# Patient Record
Sex: Female | Born: 1995 | Race: White | Hispanic: No | Marital: Single | Smoking: Never smoker
Health system: Southern US, Community
[De-identification: ages and names within clinical notes are randomized; demographics above are authoritative.]

---
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
## 2014-05-12 ENCOUNTER — Ambulatory Visit: Admit: 2014-05-12 | Disposition: A | Discharge status: Home / Self Care | Payer: Self-pay | Admitting: Family Medicine

## 2014-05-13 ENCOUNTER — Ambulatory Visit: Payer: Self-pay | Admitting: Family Medicine

## 2015-04-14 DIAGNOSIS — M5416 Radiculopathy, lumbar region: Secondary | ICD-10-CM

## 2015-04-14 DIAGNOSIS — M545 Low back pain: Secondary | ICD-10-CM

## 2015-11-30 IMAGING — CR DG FOOT COMPLETE 3+V*L*
1 series · 3 of 3 positions shown · non-contrast
Comparison: None.

CLINICAL DATA: Left foot pain for several weeks, medial dorsal pain

EXAM:
LEFT FOOT - COMPLETE 3+ VIEW

[Series 1: dxr foot lt comp w/obliques · 0.14mm/px · 3 of 3 slices shown]
[im 1/3]
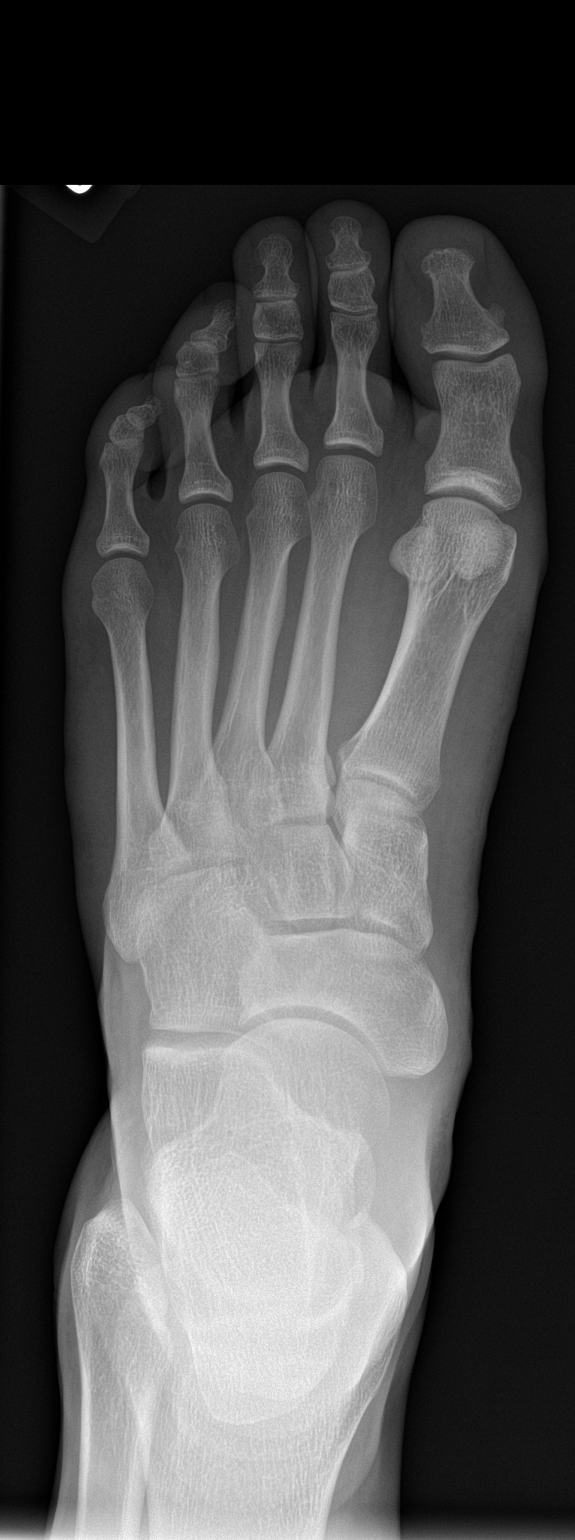
[im 2/3]
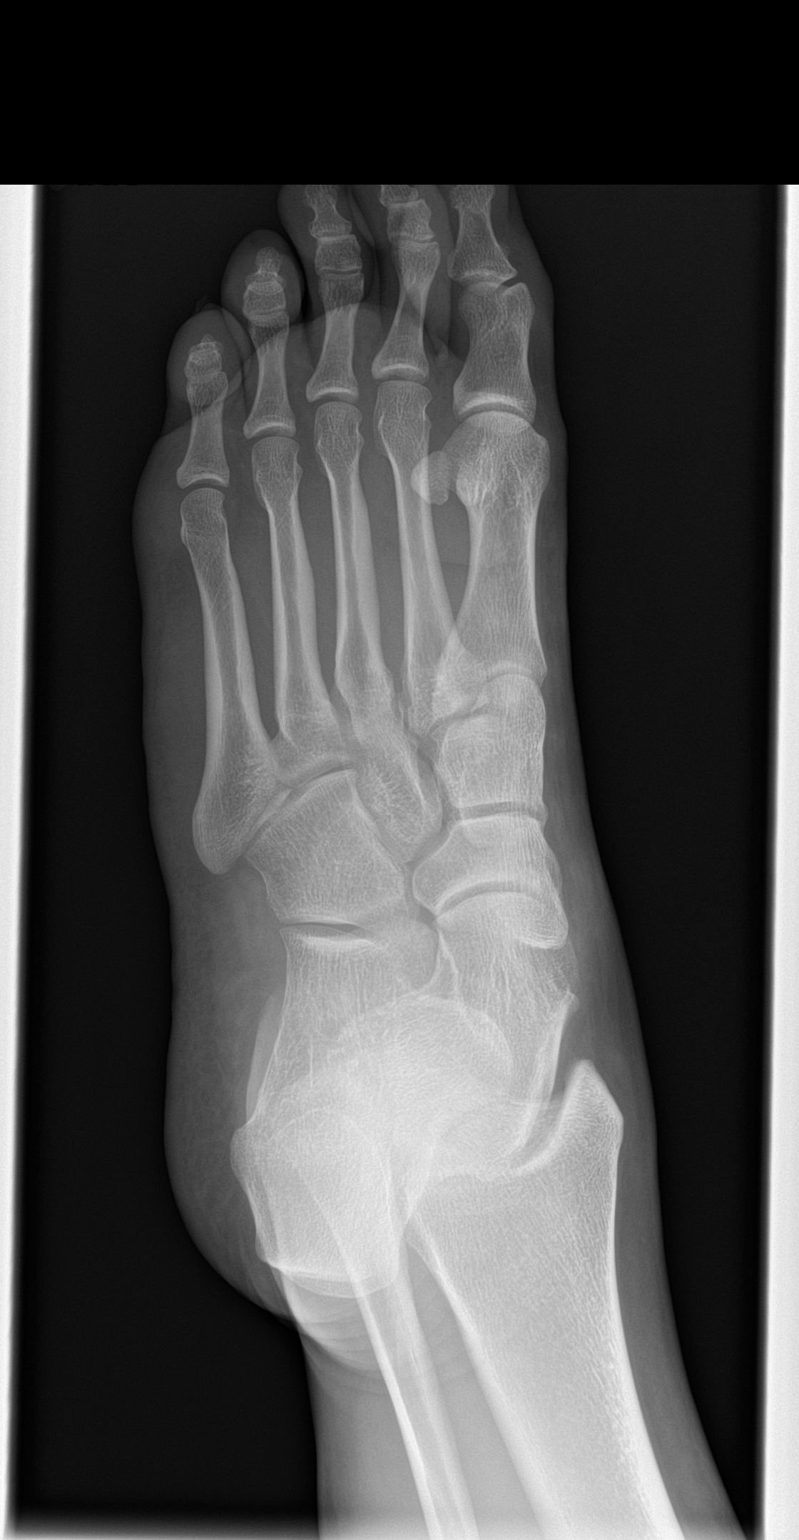
[im 3/3]
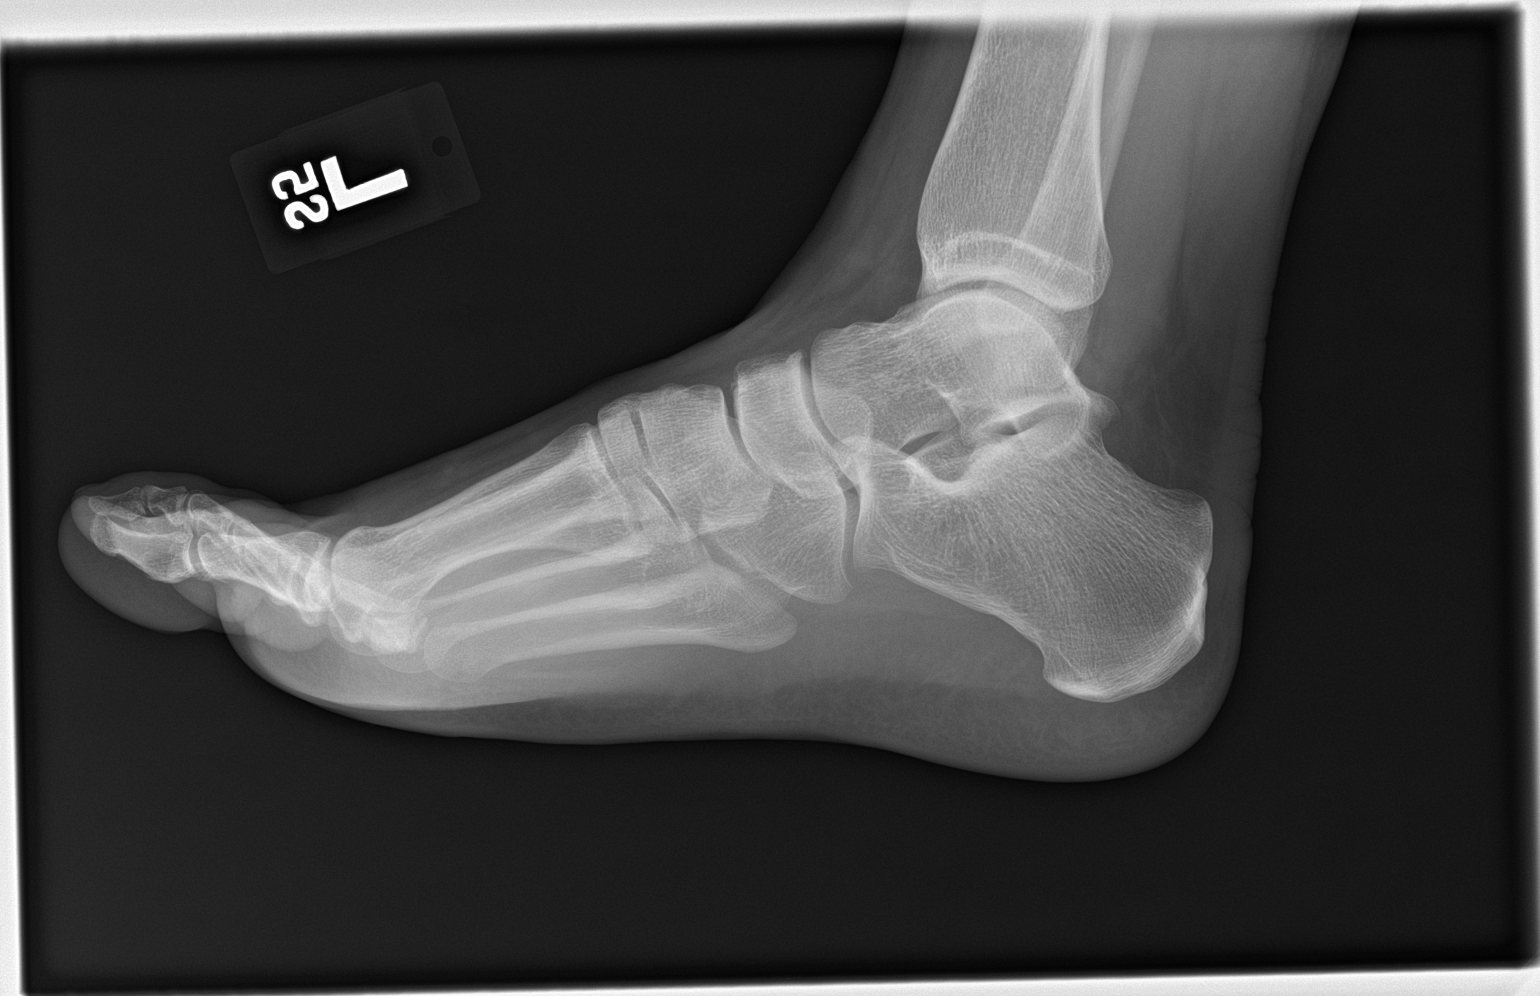

[3 of 3 positions shown; findings below may reference images not displayed]

FINDINGS: Three views of the left foot submitted. No acute fracture or
subluxation. No radiopaque foreign body.
IMPRESSION: Negative.

## 2016-03-05 ENCOUNTER — Encounter: Payer: Self-pay | Admitting: Family Medicine

## 2016-03-05 ENCOUNTER — Ambulatory Visit (INDEPENDENT_AMBULATORY_CARE_PROVIDER_SITE_OTHER): Payer: Managed Care, Other (non HMO) | Admitting: Family Medicine

## 2016-03-05 VITALS — BP 115/60 | HR 47 | Resp 16

## 2016-03-05 DIAGNOSIS — Z025 Encounter for examination for participation in sport: Secondary | ICD-10-CM | POA: Diagnosis not present

## 2016-03-05 NOTE — Progress Notes (Signed)
Patient presents today for lab work. Patient is cross country runner. She recently had her sports physical. We are doing a CBC to check for H&H, ferritin level, vitamin D level today. Patient is agreeable to this. She denies having any history of stress fracture, irregular menstrual cycles, chronic fatigue. Patient admits to having normal hemoglobin and iron levels however they were low normal. She's never been diagnosed with vitamin D deficiency. Patient does take iron supplement. This was recommended by her coach to take.  ROS: Negative except mentioned above.  Vitals as per Epic.  GENERAL: NAD RESP: CTA B CARD: RRR NEURO: CN II-XII grossly intact   A/P: Sports Physical Labs -Will inform patient of results if any supplementation or further bloodwork  is needed. F/U prn.

## 2016-03-06 LAB — CBC WITH DIFFERENTIAL/PLATELET
BASOS ABS: 0 10*3/uL (ref 0.0–0.2)
BASOS: 0 %
EOS (ABSOLUTE): 0 10*3/uL (ref 0.0–0.4)
Eos: 0 %
Hematocrit: 40.7 % (ref 34.0–46.6)
Hemoglobin: 13.8 g/dL (ref 11.1–15.9)
IMMATURE GRANS (ABS): 0 10*3/uL (ref 0.0–0.1)
IMMATURE GRANULOCYTES: 0 %
LYMPHS: 13 %
Lymphocytes Absolute: 1.2 10*3/uL (ref 0.7–3.1)
MCH: 32.7 pg (ref 26.6–33.0)
MCHC: 33.9 g/dL (ref 31.5–35.7)
MCV: 96 fL (ref 79–97)
MONOS ABS: 0.4 10*3/uL (ref 0.1–0.9)
Monocytes: 4 %
NEUTROS PCT: 83 %
Neutrophils Absolute: 7.3 10*3/uL — ABNORMAL HIGH (ref 1.4–7.0)
PLATELETS: 218 10*3/uL (ref 150–379)
RBC: 4.22 x10E6/uL (ref 3.77–5.28)
RDW: 13.8 % (ref 12.3–15.4)
WBC: 8.9 10*3/uL (ref 3.4–10.8)

## 2016-03-06 LAB — VITAMIN D 25 HYDROXY (VIT D DEFICIENCY, FRACTURES): Vit D, 25-Hydroxy: 37.8 ng/mL (ref 30.0–100.0)

## 2016-03-06 LAB — FERRITIN: FERRITIN: 66 ng/mL (ref 15–150)

## 2016-07-16 ENCOUNTER — Ambulatory Visit (INDEPENDENT_AMBULATORY_CARE_PROVIDER_SITE_OTHER): Payer: Managed Care, Other (non HMO) | Admitting: Family Medicine

## 2016-07-16 ENCOUNTER — Encounter: Payer: Self-pay | Admitting: Family Medicine

## 2016-07-16 VITALS — BP 124/78 | HR 68 | Temp 97.6°F | Resp 14

## 2016-07-16 DIAGNOSIS — E559 Vitamin D deficiency, unspecified: Secondary | ICD-10-CM

## 2016-07-16 NOTE — Progress Notes (Signed)
Patient presents today for repeat blood work. Patient's vitamin D level was 37.8 and ferritin level was 66 on 03/05/16. Patient denies taking any vitamin D supplement however does take iron supplements. She is unsure of the dosage of the iron. She does take it daily. She denies any menstrual or fatigue issues. She has had no major athletic injuries this season. She denies any previous history of stress in her stress fracture.  Exam- deferred  A/P Will repeat vitamin D level. Will inform patient if any need for supplementation. She will get me the dosage of how much iron she is taking daily. Seek medical attention as needed.

## 2016-07-17 LAB — VITAMIN D 25 HYDROXY (VIT D DEFICIENCY, FRACTURES): VIT D 25 HYDROXY: 26.5 ng/mL — AB (ref 30.0–100.0)

## 2016-11-08 ENCOUNTER — Encounter: Payer: Self-pay | Admitting: Family Medicine

## 2016-11-08 ENCOUNTER — Ambulatory Visit (INDEPENDENT_AMBULATORY_CARE_PROVIDER_SITE_OTHER): Payer: Managed Care, Other (non HMO) | Admitting: Family Medicine

## 2016-11-08 VITALS — BP 132/75 | HR 61 | Temp 97.6°F | Resp 14

## 2016-11-08 DIAGNOSIS — E559 Vitamin D deficiency, unspecified: Secondary | ICD-10-CM

## 2016-11-08 NOTE — Progress Notes (Signed)
Patient presents today for repeat vitamin D level. Patient's vitamin D level 06/2016 was 26.5. Patient is not taking any vitamin D supplement. Patient states that she does not eat any dairy. She does not take any calcium or multivitamin supplement. She denies any problems with anemia in the past. Her ferritin level was 66, 02/2016. She denies any stress injuries the season.  ROS: Negative except mentioned above. Vitals as per Epic.   A/P: Vitamin D deficiency -no repeat vitamin D level, will inform patient if she needs to take a supplement, would recommend multivitamin supplement with calcium, seek medical attention if any problems.

## 2016-11-08 NOTE — Addendum Note (Signed)
Addended by: Dione HousekeeperPATEL, Kelyn Ponciano N on: 11/08/2016 11:04 AM   Modules accepted: Orders

## 2016-11-09 LAB — VITAMIN D 25 HYDROXY (VIT D DEFICIENCY, FRACTURES): Vit D, 25-Hydroxy: 38.5 ng/mL (ref 30.0–100.0)
